# Patient Record
Sex: Male | Born: 1995 | Hispanic: No | Marital: Single | Smoking: Never smoker
Health system: Southern US, Community
[De-identification: ages and names within clinical notes are randomized; demographics above are authoritative.]

## PROBLEM LIST (undated history)

## (undated) DIAGNOSIS — T148XXA Other injury of unspecified body region, initial encounter: Secondary | ICD-10-CM

---
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
## 2020-07-12 ENCOUNTER — Other Ambulatory Visit: Payer: Self-pay

## 2020-07-12 ENCOUNTER — Emergency Department (HOSPITAL_COMMUNITY): Payer: Self-pay

## 2020-07-12 ENCOUNTER — Emergency Department (HOSPITAL_COMMUNITY)
Admission: EM | Admit: 2020-07-12 | Discharge: 2020-07-12 | Disposition: A | Discharge status: Home / Self Care | Payer: Self-pay | Attending: Emergency Medicine | Admitting: Emergency Medicine

## 2020-07-12 ENCOUNTER — Encounter (HOSPITAL_COMMUNITY): Payer: Self-pay

## 2020-07-12 DIAGNOSIS — R112 Nausea with vomiting, unspecified: Secondary | ICD-10-CM

## 2020-07-12 DIAGNOSIS — R1084 Generalized abdominal pain: Secondary | ICD-10-CM | POA: Insufficient documentation

## 2020-07-12 DIAGNOSIS — E860 Dehydration: Secondary | ICD-10-CM | POA: Insufficient documentation

## 2020-07-12 DIAGNOSIS — F1290 Cannabis use, unspecified, uncomplicated: Secondary | ICD-10-CM

## 2020-07-12 DIAGNOSIS — F12188 Cannabis abuse with other cannabis-induced disorder: Secondary | ICD-10-CM | POA: Insufficient documentation

## 2020-07-12 DIAGNOSIS — R0602 Shortness of breath: Secondary | ICD-10-CM | POA: Insufficient documentation

## 2020-07-12 DIAGNOSIS — R079 Chest pain, unspecified: Secondary | ICD-10-CM | POA: Insufficient documentation

## 2020-07-12 HISTORY — DX: Other injury of unspecified body region, initial encounter: T14.8XXA

## 2020-07-12 LAB — URINALYSIS, ROUTINE W REFLEX MICROSCOPIC
Bacteria, UA: NONE SEEN
Bilirubin Urine: NEGATIVE
Glucose, UA: NEGATIVE mg/dL
Ketones, ur: 80 mg/dL — AB
Leukocytes,Ua: NEGATIVE
Nitrite: NEGATIVE
Protein, ur: 100 mg/dL — AB
Specific Gravity, Urine: 1.033 — ABNORMAL HIGH (ref 1.005–1.030)
pH: 6 (ref 5.0–8.0)

## 2020-07-12 LAB — CBC
HCT: 51.5 % (ref 39.0–52.0)
Hemoglobin: 17.5 g/dL — ABNORMAL HIGH (ref 13.0–17.0)
MCH: 28.9 pg (ref 26.0–34.0)
MCHC: 34 g/dL (ref 30.0–36.0)
MCV: 85 fL (ref 80.0–100.0)
Platelets: 295 10*3/uL (ref 150–400)
RBC: 6.06 MIL/uL — ABNORMAL HIGH (ref 4.22–5.81)
RDW: 12.5 % (ref 11.5–15.5)
WBC: 17 10*3/uL — ABNORMAL HIGH (ref 4.0–10.5)
nRBC: 0 % (ref 0.0–0.2)

## 2020-07-12 LAB — COMPREHENSIVE METABOLIC PANEL
ALT: 21 U/L (ref 0–44)
AST: 17 U/L (ref 15–41)
Albumin: 4.8 g/dL (ref 3.5–5.0)
Alkaline Phosphatase: 60 U/L (ref 38–126)
Anion gap: 14 (ref 5–15)
BUN: 32 mg/dL — ABNORMAL HIGH (ref 6–20)
CO2: 25 mmol/L (ref 22–32)
Calcium: 10.5 mg/dL — ABNORMAL HIGH (ref 8.9–10.3)
Chloride: 101 mmol/L (ref 98–111)
Creatinine, Ser: 1.19 mg/dL (ref 0.61–1.24)
GFR, Estimated: >60 mL/min (ref >60)
Glucose, Bld: 112 mg/dL — ABNORMAL HIGH (ref 70–99)
Potassium: 3.3 mmol/L — ABNORMAL LOW (ref 3.5–5.1)
Sodium: 140 mmol/L (ref 135–145)
Total Bilirubin: 1.8 mg/dL — ABNORMAL HIGH (ref 0.3–1.2)
Total Protein: 8.8 g/dL — ABNORMAL HIGH (ref 6.5–8.1)

## 2020-07-12 LAB — LIPASE, BLOOD: Lipase: 32 U/L (ref 11–51)

## 2020-07-12 MED ORDER — METOCLOPRAMIDE HCL 5 MG/ML IJ SOLN
10.0000 mg | Freq: Once | INTRAMUSCULAR | Status: AC
  Administered 2020-07-12: 10 mg via INTRAVENOUS
  Filled 2020-07-12: qty 2

## 2020-07-12 MED ORDER — ONDANSETRON HCL 4 MG/2ML IJ SOLN
4.0000 mg | Freq: Once | INTRAMUSCULAR | Status: AC
  Administered 2020-07-12: 4 mg via INTRAVENOUS
  Filled 2020-07-12: qty 2

## 2020-07-12 MED ORDER — SODIUM CHLORIDE 0.9 % IV BOLUS
1000.0000 mL | Freq: Once | INTRAVENOUS | Status: AC
  Administered 2020-07-12: 1000 mL via INTRAVENOUS

## 2020-07-12 MED ORDER — LACTATED RINGERS IV BOLUS
1000.0000 mL | Freq: Once | INTRAVENOUS | Status: AC
  Administered 2020-07-12: 1000 mL via INTRAVENOUS

## 2020-07-12 MED ORDER — DROPERIDOL 2.5 MG/ML IJ SOLN
2.5000 mg | Freq: Once | INTRAMUSCULAR | Status: AC
  Administered 2020-07-12: 2.5 mg via INTRAVENOUS
  Filled 2020-07-12: qty 2

## 2020-07-12 MED ORDER — PROMETHAZINE HCL 25 MG PO TABS: 25.0000 mg | ORAL_TABLET | Freq: Four times a day (QID) | ORAL | 0 refills | Status: AC | PRN

## 2020-07-12 NOTE — ED Notes (Signed)
Pt ambulated to bathroom on his own power, gait even and steady. 
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
## 2020-07-12 NOTE — ED Notes (Signed)
Pt asked for and given saltine crackers. Pt sitting in chair eating crackers at this time

## 2020-07-12 NOTE — ED Notes (Signed)
Patient verbalizes understanding of discharge instructions. Opportunity for questioning and answers were provided. Pt discharged from ED. 

## 2020-07-12 NOTE — Discharge Instructions (Addendum)
Thank you for allowing me to care for you today in the Emergency Department.   I strongly suspect that your symptoms are due to marijuana use.  Sometimes instead of helping nausea and vomiting marijuana can actually cause nausea and vomiting.  I would recommend that you stop using marijuana.  Make sure that you are drinking plenty of fluids as you appeared very dehydrated today.  Gatorade and Pedialyte are available over-the-counter and can help replenish electrolytes.  You can resume the medications that you were given at [facility].  Take Phenergan and Zofran as prescribed.  Call the number on your discharge paperwork to get established with a primary care provider.  Return to the emergency department if you stop producing urine, if you pass out, develop respiratory distress, or have other new, concerning symptoms.

## 2020-07-12 NOTE — ED Notes (Signed)
Pt provided with ginger ale and water and instructed on PO challenge, Pt voiced understanding

## 2020-07-12 NOTE — ED Notes (Signed)
Patient transported to X-ray 

## 2020-07-12 NOTE — ED Triage Notes (Signed)
Pt reports that he has been having n/v for the psat 3-4 days and has not had a BM in 4 days.
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
## 2020-07-12 NOTE — ED Notes (Signed)
Pt tolerating saltine crackers and states he feels that he is ready for discharge, will notify MD

## 2020-07-12 NOTE — ED Notes (Signed)
Pt tolerating small sips of water and ginger ale, encouraged pt to attempt to drink a bit more often. Will continue to monitor

## 2020-07-13 ENCOUNTER — Emergency Department (HOSPITAL_COMMUNITY)
Admission: EM | Admit: 2020-07-13 | Discharge: 2020-07-13 | Disposition: A | Discharge status: Home / Self Care | Payer: Medicaid Other | Attending: Emergency Medicine | Admitting: Emergency Medicine

## 2020-07-13 ENCOUNTER — Encounter (HOSPITAL_COMMUNITY): Payer: Self-pay

## 2020-07-13 ENCOUNTER — Other Ambulatory Visit: Payer: Self-pay

## 2020-07-13 DIAGNOSIS — E876 Hypokalemia: Secondary | ICD-10-CM | POA: Insufficient documentation

## 2020-07-13 DIAGNOSIS — F12188 Cannabis abuse with other cannabis-induced disorder: Secondary | ICD-10-CM | POA: Insufficient documentation

## 2020-07-13 DIAGNOSIS — R112 Nausea with vomiting, unspecified: Secondary | ICD-10-CM | POA: Insufficient documentation

## 2020-07-13 LAB — CBC WITH DIFFERENTIAL/PLATELET
Basophils Absolute: 0.1 10*3/uL (ref 0.0–0.1)
Basophils Relative: 1 %
Eosinophils Absolute: 0.1 10*3/uL (ref 0.0–0.5)
Eosinophils Relative: 1 %
HCT: 47.2 % (ref 39.0–52.0)
Hemoglobin: 16.3 g/dL (ref 13.0–17.0)
Lymphocytes Relative: 11 %
Lymphs Abs: 1.3 10*3/uL (ref 0.7–4.0)
MCH: 29.5 pg (ref 26.0–34.0)
MCHC: 34.5 g/dL (ref 30.0–36.0)
MCV: 85.4 fL (ref 80.0–100.0)
Monocytes Absolute: 1.2 10*3/uL — ABNORMAL HIGH (ref 0.1–1.0)
Monocytes Relative: 10 %
Neutro Abs: 8.9 10*3/uL — ABNORMAL HIGH (ref 1.7–7.7)
Neutrophils Relative %: 77 %
Platelets: 239 10*3/uL (ref 150–400)
RBC: 5.53 MIL/uL (ref 4.22–5.81)
RDW: 12.2 % (ref 11.5–15.5)
WBC: 11.6 10*3/uL — ABNORMAL HIGH (ref 4.0–10.5)
nRBC: 0 /100 WBC

## 2020-07-13 LAB — COMPREHENSIVE METABOLIC PANEL
ALT: 53 U/L — ABNORMAL HIGH (ref 0–44)
AST: 37 U/L (ref 15–41)
Albumin: 4.3 g/dL (ref 3.5–5.0)
Alkaline Phosphatase: 53 U/L (ref 38–126)
Anion gap: 13 (ref 5–15)
BUN: 26 mg/dL — ABNORMAL HIGH (ref 6–20)
CO2: 22 mmol/L (ref 22–32)
Calcium: 9.3 mg/dL (ref 8.9–10.3)
Chloride: 102 mmol/L (ref 98–111)
Creatinine, Ser: 0.91 mg/dL (ref 0.61–1.24)
GFR, Estimated: >60 mL/min (ref >60)
Glucose, Bld: 96 mg/dL (ref 70–99)
Potassium: 2.9 mmol/L — ABNORMAL LOW (ref 3.5–5.1)
Sodium: 137 mmol/L (ref 135–145)
Total Bilirubin: 2.2 mg/dL — ABNORMAL HIGH (ref 0.3–1.2)
Total Protein: 7.6 g/dL (ref 6.5–8.1)

## 2020-07-13 LAB — LIPASE, BLOOD: Lipase: 30 U/L (ref 11–51)

## 2020-07-13 MED ORDER — POTASSIUM CHLORIDE 10 MEQ/100ML IV SOLN
10.0000 meq | INTRAVENOUS | Status: AC
  Administered 2020-07-13 (×2): 10 meq via INTRAVENOUS
  Filled 2020-07-13 (×2): qty 100

## 2020-07-13 MED ORDER — HALOPERIDOL LACTATE 5 MG/ML IJ SOLN
5.0000 mg | Freq: Once | INTRAMUSCULAR | Status: AC
  Administered 2020-07-13: 5 mg via INTRAVENOUS
  Filled 2020-07-13: qty 1

## 2020-07-13 MED ORDER — SODIUM CHLORIDE 0.9 % IV SOLN: INTRAVENOUS | Status: DC

## 2020-07-13 MED ORDER — METOCLOPRAMIDE HCL 5 MG/ML IJ SOLN
10.0000 mg | Freq: Once | INTRAMUSCULAR | Status: AC
  Administered 2020-07-13: 10 mg via INTRAVENOUS
  Filled 2020-07-13: qty 2

## 2020-07-13 MED ORDER — SODIUM CHLORIDE 0.9 % IV BOLUS
1000.0000 mL | Freq: Once | INTRAVENOUS | Status: AC
  Administered 2020-07-13: 1000 mL via INTRAVENOUS

## 2020-07-13 MED ORDER — ONDANSETRON 4 MG PO TBDP
4.0000 mg | ORAL_TABLET | Freq: Once | ORAL | Status: AC
  Administered 2020-07-13: 4 mg via ORAL
  Filled 2020-07-13: qty 1

## 2020-07-13 NOTE — ED Triage Notes (Signed)
Pt presents to ED via [facility] for generalized abdominal pain, nausea and vomiting x 1 week. Pt states he passed out today. Pt has been seen at [facility] and [facility] for the same problem.

## 2020-07-13 NOTE — Discharge Instructions (Addendum)
Continue your antinausea medicines at home.  Work on just clear liquids initially.  Potassium was a little low here today but has been replaced.  Foods high in potassium have provided above.  If still having difficulty with persistent nausea and vomiting tomorrow return.  Received IV potassium today to help build that up.
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
## 2020-07-13 NOTE — ED Notes (Signed)
Pt. Is up walking with a steady gait in their. They vomited twice. Notified pt. To slow down drinking their water. Pt. Still drank their water fast and threw up again. Notified pt. To switch to ice chips until their stomach can handle the fluids.

## 2020-09-05 ENCOUNTER — Other Ambulatory Visit: Payer: Self-pay

## 2020-09-05 ENCOUNTER — Emergency Department (HOSPITAL_COMMUNITY)
Admission: EM | Admit: 2020-09-05 | Discharge: 2020-09-06 | Payer: Self-pay | Attending: Emergency Medicine | Admitting: Emergency Medicine

## 2020-09-05 DIAGNOSIS — Z20822 Contact with and (suspected) exposure to covid-19: Secondary | ICD-10-CM | POA: Insufficient documentation

## 2020-09-05 DIAGNOSIS — J982 Interstitial emphysema: Secondary | ICD-10-CM

## 2020-09-05 DIAGNOSIS — R1084 Generalized abdominal pain: Secondary | ICD-10-CM | POA: Insufficient documentation

## 2020-09-05 DIAGNOSIS — R112 Nausea with vomiting, unspecified: Secondary | ICD-10-CM | POA: Insufficient documentation

## 2020-09-05 LAB — CBC WITH DIFFERENTIAL/PLATELET
Abs Immature Granulocytes: 0.14 10*3/uL — ABNORMAL HIGH (ref 0.00–0.07)
Basophils Absolute: 0.1 10*3/uL (ref 0.0–0.1)
Basophils Relative: 0 %
Eosinophils Absolute: 0 10*3/uL (ref 0.0–0.5)
Eosinophils Relative: 0 %
HCT: 48.2 % (ref 39.0–52.0)
Hemoglobin: 16.7 g/dL (ref 13.0–17.0)
Immature Granulocytes: 1 %
Lymphocytes Relative: 5 %
Lymphs Abs: 1.1 10*3/uL (ref 0.7–4.0)
MCH: 30.2 pg (ref 26.0–34.0)
MCHC: 34.6 g/dL (ref 30.0–36.0)
MCV: 87.2 fL (ref 80.0–100.0)
Monocytes Absolute: 1.9 10*3/uL — ABNORMAL HIGH (ref 0.1–1.0)
Monocytes Relative: 10 %
Neutro Abs: 16.9 10*3/uL — ABNORMAL HIGH (ref 1.7–7.7)
Neutrophils Relative %: 84 %
Platelets: 260 10*3/uL (ref 150–400)
RBC: 5.53 MIL/uL (ref 4.22–5.81)
RDW: 13 % (ref 11.5–15.5)
WBC: 20.2 10*3/uL — ABNORMAL HIGH (ref 4.0–10.5)
nRBC: 0 % (ref 0.0–0.2)

## 2020-09-05 LAB — COMPREHENSIVE METABOLIC PANEL
ALT: 24 U/L (ref 0–44)
AST: 26 U/L (ref 15–41)
Albumin: 5.4 g/dL — ABNORMAL HIGH (ref 3.5–5.0)
Alkaline Phosphatase: 58 U/L (ref 38–126)
Anion gap: 12 (ref 5–15)
BUN: 37 mg/dL — ABNORMAL HIGH (ref 6–20)
CO2: 23 mmol/L (ref 22–32)
Calcium: 10.3 mg/dL (ref 8.9–10.3)
Chloride: 104 mmol/L (ref 98–111)
Creatinine, Ser: 1.15 mg/dL (ref 0.61–1.24)
GFR, Estimated: >60 mL/min (ref >60)
Glucose, Bld: 117 mg/dL — ABNORMAL HIGH (ref 70–99)
Potassium: 3.4 mmol/L — ABNORMAL LOW (ref 3.5–5.1)
Sodium: 139 mmol/L (ref 135–145)
Total Bilirubin: 1.9 mg/dL — ABNORMAL HIGH (ref 0.3–1.2)
Total Protein: 8.8 g/dL — ABNORMAL HIGH (ref 6.5–8.1)

## 2020-09-05 LAB — LIPASE, BLOOD: Lipase: 28 U/L (ref 11–51)

## 2020-09-05 MED ORDER — HALOPERIDOL LACTATE 5 MG/ML IJ SOLN
5.0000 mg | Freq: Once | INTRAMUSCULAR | Status: AC
  Administered 2020-09-06: 5 mg via INTRAVENOUS
  Filled 2020-09-05: qty 1

## 2020-09-05 MED ORDER — KETOROLAC TROMETHAMINE 30 MG/ML IJ SOLN
30.0000 mg | Freq: Once | INTRAMUSCULAR | Status: AC
  Administered 2020-09-05: 30 mg via INTRAVENOUS
  Filled 2020-09-05: qty 1

## 2020-09-05 MED ORDER — ONDANSETRON HCL 4 MG PO TABS
4.0000 mg | ORAL_TABLET | Freq: Once | ORAL | Status: AC
  Administered 2020-09-06: 4 mg via ORAL
  Filled 2020-09-05 (×2): qty 1

## 2020-09-05 MED ORDER — SODIUM CHLORIDE 0.9 % IV BOLUS
1000.0000 mL | Freq: Once | INTRAVENOUS | Status: AC
  Administered 2020-09-05: 1000 mL via INTRAVENOUS

## 2020-09-05 MED ORDER — ONDANSETRON HCL 4 MG/2ML IJ SOLN
4.0000 mg | Freq: Once | INTRAMUSCULAR | Status: AC
  Administered 2020-09-05: 4 mg via INTRAVENOUS
  Filled 2020-09-05: qty 2
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
## 2020-09-05 NOTE — ED Triage Notes (Signed)
Pt arrived via [facility] c/o abd pain x3 months.  Has Nexium and Zofran prescribed.  Just an FYI: pt has ankle monitor but has not informed anyone of him living home.
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
## 2020-09-05 NOTE — ED Triage Notes (Addendum)
Pt ambulatory to triage c/o "having bacteria in my stomach."  Pt has been taking prescription meds as prescribed but states that it has not been helping.

## 2020-09-06 ENCOUNTER — Emergency Department (HOSPITAL_COMMUNITY): Payer: Self-pay

## 2020-09-06 LAB — RESP PANEL BY RT-PCR (FLU A&B, COVID) ARPGX2
Influenza A by PCR: NEGATIVE
Influenza B by PCR: NEGATIVE
SARS Coronavirus 2 by RT PCR: NEGATIVE

## 2020-09-06 MED ORDER — FLUCONAZOLE IN SODIUM CHLORIDE 200-0.9 MG/100ML-% IV SOLN: 200.0000 mg | Freq: Once | INTRAVENOUS | 0 refills | Status: AC

## 2020-09-06 MED ORDER — SODIUM CHLORIDE 0.9 % IV SOLN
12.5000 mg | Freq: Once | INTRAVENOUS | Status: AC
  Administered 2020-09-06: 12.5 mg via INTRAVENOUS
  Filled 2020-09-06: qty 0.5

## 2020-09-06 MED ORDER — IOHEXOL 300 MG/ML  SOLN
100.0000 mL | Freq: Once | INTRAMUSCULAR | Status: AC | PRN
  Administered 2020-09-06: 100 mL via INTRAVENOUS

## 2020-09-06 MED ORDER — SODIUM CHLORIDE 0.9 % IV BOLUS
1000.0000 mL | Freq: Once | INTRAVENOUS | Status: AC
  Administered 2020-09-06: 1000 mL via INTRAVENOUS

## 2020-09-06 MED ORDER — SODIUM CHLORIDE 0.9 % IV SOLN: INTRAVENOUS | Status: DC

## 2020-09-06 MED ORDER — METOCLOPRAMIDE HCL 5 MG/ML IJ SOLN
10.0000 mg | Freq: Once | INTRAMUSCULAR | Status: AC
  Administered 2020-09-06: 10 mg via INTRAVENOUS
  Filled 2020-09-06: qty 2

## 2020-09-06 MED ORDER — FENTANYL CITRATE (PF) 100 MCG/2ML IJ SOLN
50.0000 ug | Freq: Once | INTRAMUSCULAR | Status: AC
Start: 2020-09-06 — End: 2020-09-06
  Administered 2020-09-06: 50 ug via INTRAVENOUS
  Filled 2020-09-06: qty 2

## 2020-09-06 MED ORDER — PIPERACILLIN-TAZOBACTAM 3.375 G IVPB
3.3750 g | Freq: Once | INTRAVENOUS | Status: AC
  Administered 2020-09-06: 3.375 g via INTRAVENOUS
  Filled 2020-09-06: qty 50

## 2020-09-06 MED ORDER — HYDROMORPHONE HCL 1 MG/ML IJ SOLN
1.0000 mg | Freq: Once | INTRAMUSCULAR | Status: AC
Start: 2020-09-06 — End: 2020-09-06
  Administered 2020-09-06: 1 mg via INTRAVENOUS
  Filled 2020-09-06: qty 1

## 2020-09-06 MED ORDER — PROMETHAZINE HCL 25 MG/ML IJ SOLN
INTRAMUSCULAR | Status: AC
  Filled 2020-09-06: qty 1

## 2020-09-06 NOTE — ED Notes (Signed)
Pt has not voided in last hour.  U/a not obtained on 3rd shift.

## 2022-03-07 DEATH — deceased

## 2022-07-04 IMAGING — CT CT ABD-PELV W/ CM
2 of 4 series · 15 of 46 positions shown, 17 images · IV contrast (Omnipaque or Isovue)
Comparison: CT chest, abdomen and pelvis, barium swallow 06/08/2020

CLINICAL DATA: Abdominal pain for 3 months, epigastric pain

EXAM:
CT ABDOMEN AND PELVIS WITH CONTRAST
TECHNIQUE: Multidetector CT imaging of the abdomen and pelvis was performed
using the standard protocol following bolus administration of
intravenous contrast.
CONTRAST:  100mL OMNIPAQUE IOHEXOL 300 MG/ML  SOLN

[Series 2: axial st · axial · 0.63mm/px · z∈[+1033,+1438]mm · 12 of 89 slices shown, 14 images]
[im 4/89  soft-tissue]
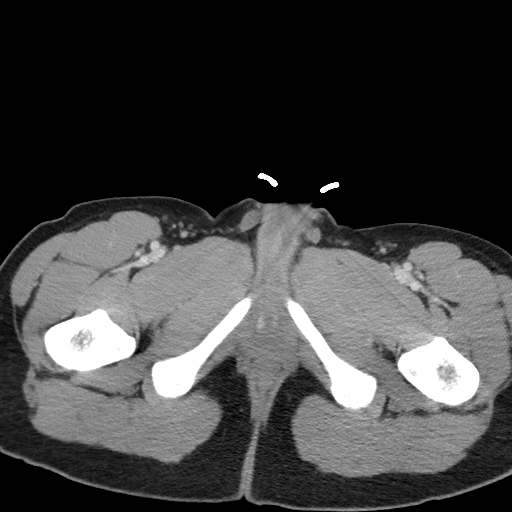
[im 4/89  bone]
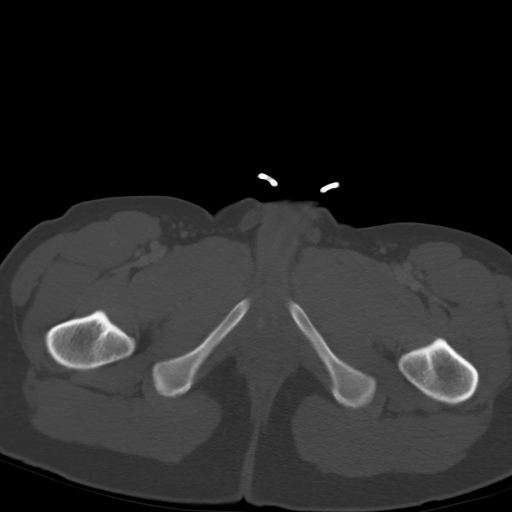
[im 12/89  soft-tissue]
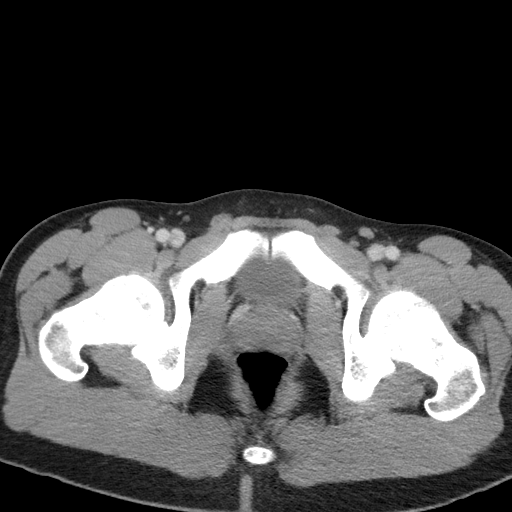
[im 20/89  soft-tissue]
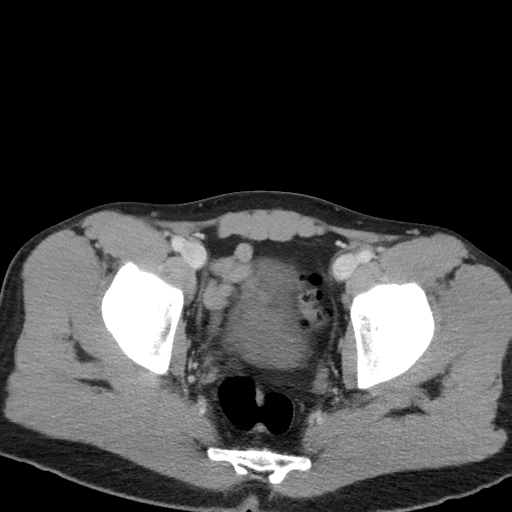
[im 27/89  soft-tissue]
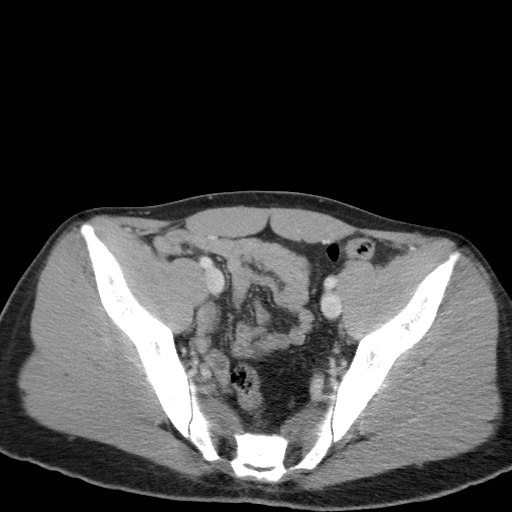
[im 35/89  soft-tissue]
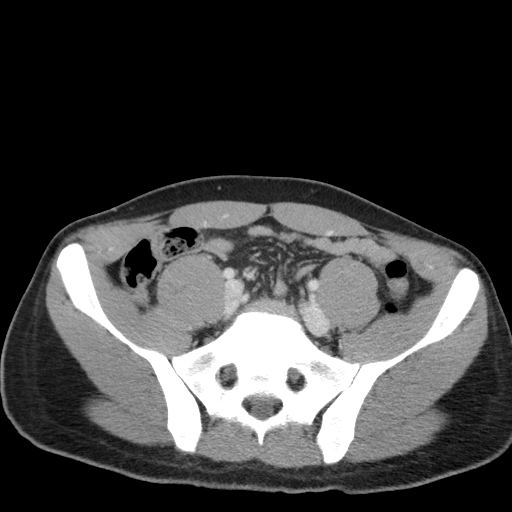
[im 43/89  soft-tissue]
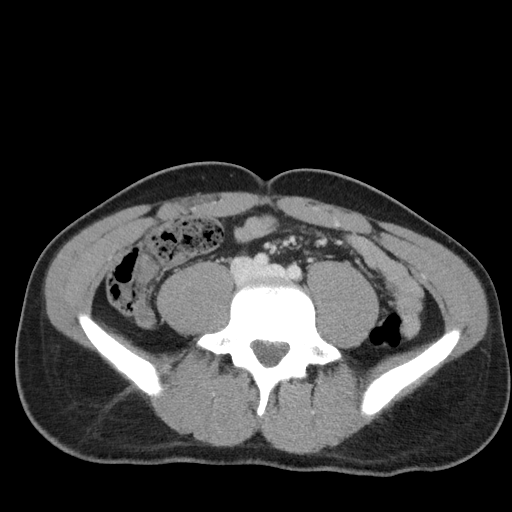
[im 46/89  soft-tissue]
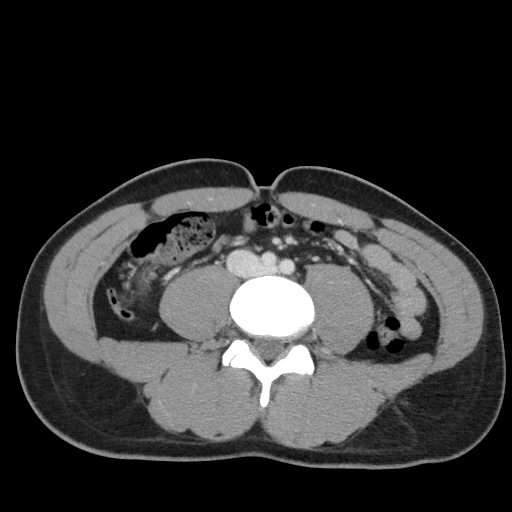
[im 54/89  soft-tissue]
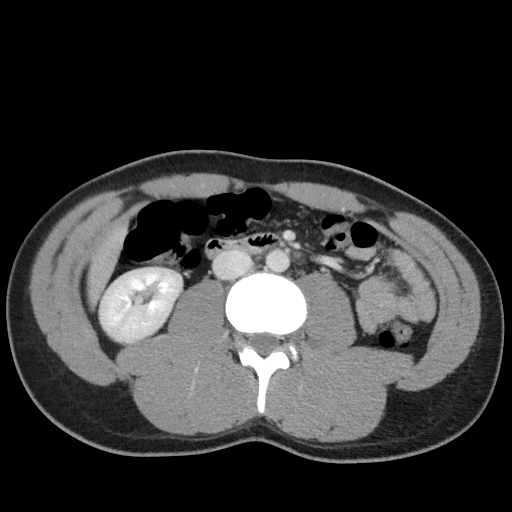
[im 62/89  soft-tissue]
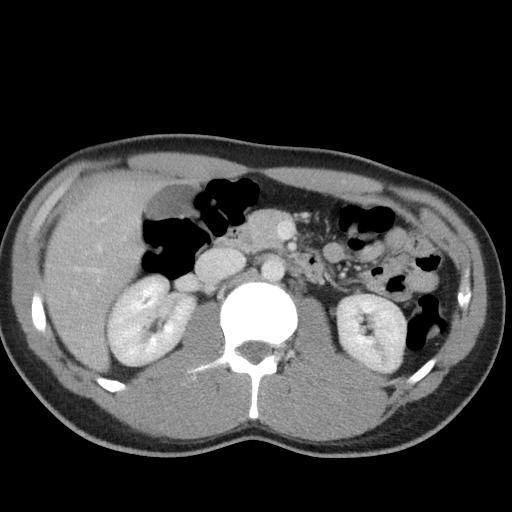
[im 62/89  bone]
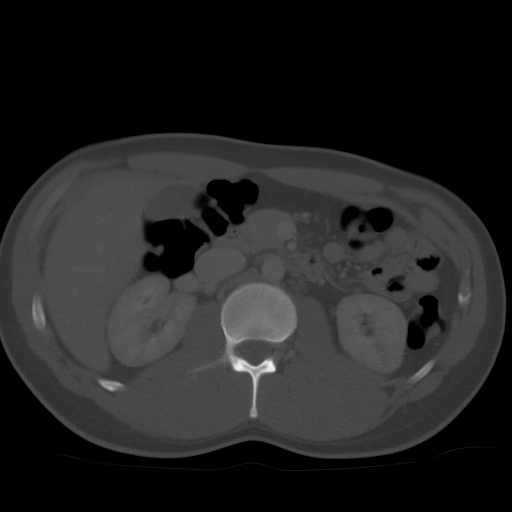
[im 69/89  soft-tissue]
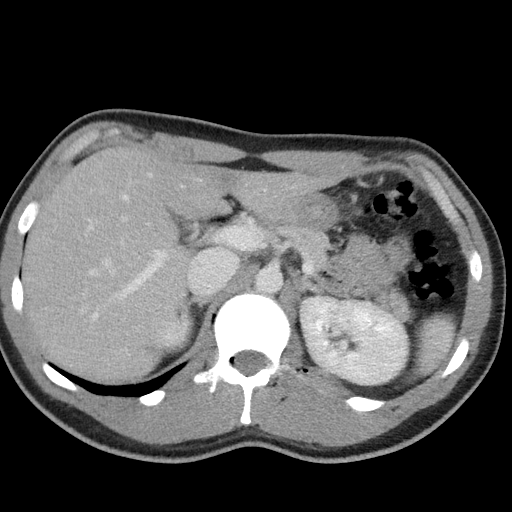
[im 77/89  soft-tissue]
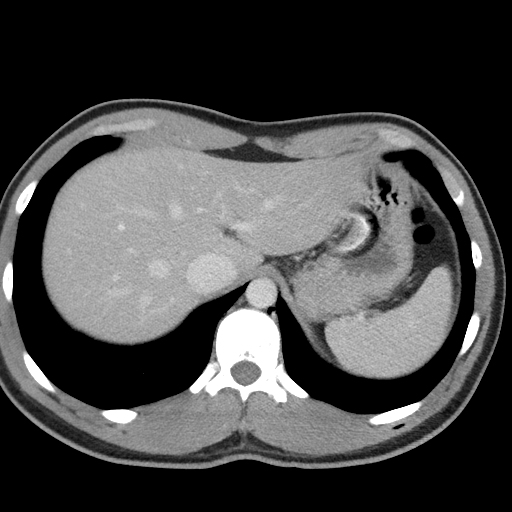
[im 85/89  soft-tissue]
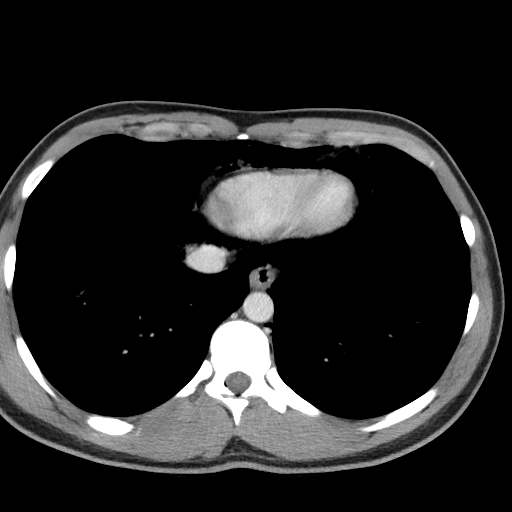

[Series 5: coronal st · coronal · 0.65mm/px · 3 of 68 slices shown]
[im 23/68  soft-tissue]
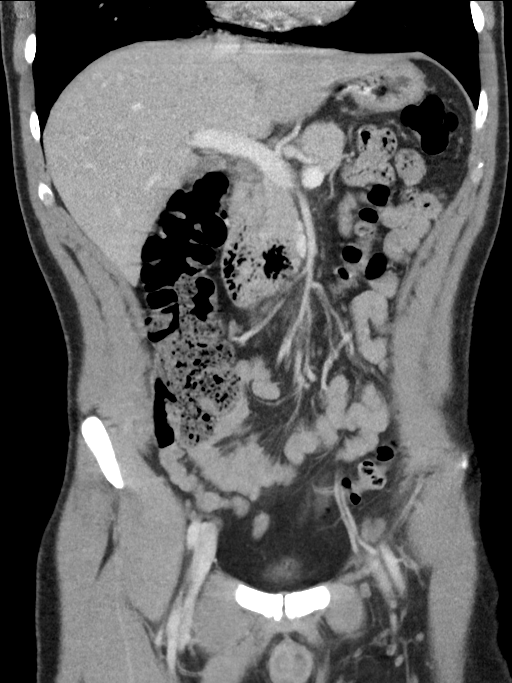
[im 30/68  soft-tissue]
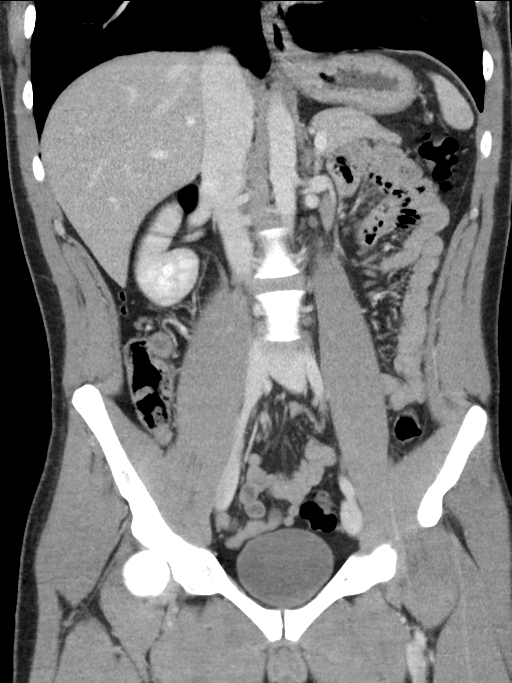
[im 38/68  soft-tissue]
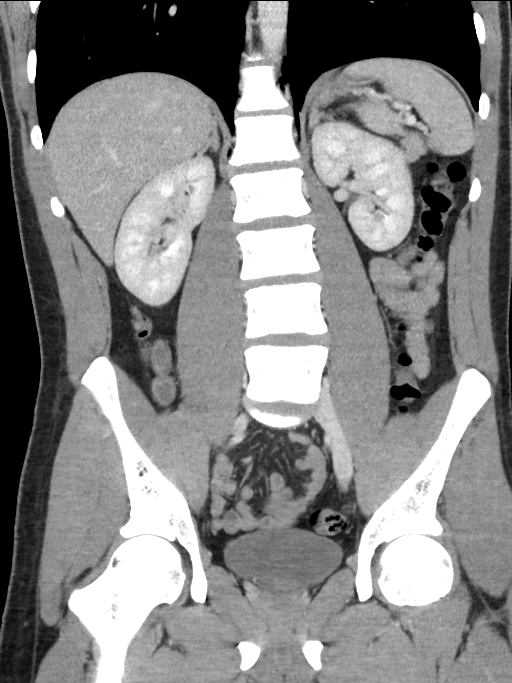

[15 of 46 positions shown; findings below may reference images not displayed]

FINDINGS: Lower chest: Extensive pneumomediastinum within the anterior, middle
and posterior mediastinum. No discernible free fluid in the
mediastinum. Small volume pneumorachis spanning the lower thoracic
levels and thoracolumbar junction to L2. Small amount of soft tissue
gas along the posterior chest wall as well. Normal cardiac size. No
clear pericardial effusion or discernible pneumopericardium is seen.
Lung bases are clear.

Hepatobiliary: No worrisome focal liver abnormality is seen. Normal
gallbladder. No visible calcified gallstones. No biliary ductal
dilatation.

Pancreas: No pancreatic ductal dilatation or surrounding
inflammatory changes.

Spleen: Normal in size. No concerning splenic lesions.

Adrenals/Urinary Tract: Normal adrenal glands. Kidneys enhance
symmetrically and uniformly. Stable subcentimeter hypoattenuating
foci in the left kidney, probable benign cysts. No urolithiasis or
hydronephrosis. Urinary bladder is unremarkable for the degree of
distention.

Stomach/Bowel: No abnormal thickening or discontinuity of the distal
thoracic esophagus. Stomach is unremarkable. Duodenum with normal
sweep across the midline abdomen. No small bowel thickening or
dilatation. Normal appendix in the right lower quadrant. No colonic
dilatation or wall thickening. No evidence of bowel obstruction.

Vascular/Lymphatic: No significant vascular findings are present. No
enlarged abdominal or pelvic lymph nodes.

Reproductive: Prostate is unremarkable.

Other: No free intraperitoneal air or fluid. Pneumomediastinum, gas
along the chest wall and pneumorachis, as above. No bowel containing
hernia.

Musculoskeletal: No acute osseous abnormality or suspicious osseous
lesion.
IMPRESSION: Extensive pneumomediastinum with gas tracking through the anterior,
middle and posterior mediastinum as well as small amount of gas
along the posterior chest wall and pneumorachis. Pneumomediastinum
was present on comparison imaging from 06/08/2020 though finding is
certainly more extensive on today's exam. No worrisome esophageal or
gastric thickening or other CT evidence to point to a definitive
source of this mediastinal gas.

No other acute CT evident abnormality of the abdomen or pelvis.

These results were called by telephone at the time of interpretation
on 09/06/2020 at [DATE] to provider NAZARETH JUMPER , who verbally
acknowledged these results.

## 2022-07-04 IMAGING — CT CT CHEST W/O CM
2 of 4 series · 15 of 36 positions shown, 18 images · non-contrast
Comparison: June 08, 2020

CLINICAL DATA: Abdominal pain and vomiting.

EXAM:
CT CHEST WITHOUT CONTRAST
TECHNIQUE: Multidetector CT imaging of the chest was performed following the
standard protocol without IV contrast.

[Series 2: routine chest without · axial · non-contrast · 0.66mm/px · z∈[+1253,+1555]mm · 12 of 179 slices shown, 15 images]
[im 14/179  mediastinal]
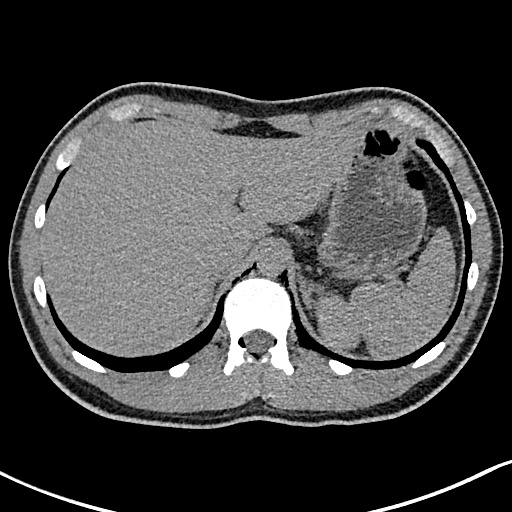
[im 14/179  lung]
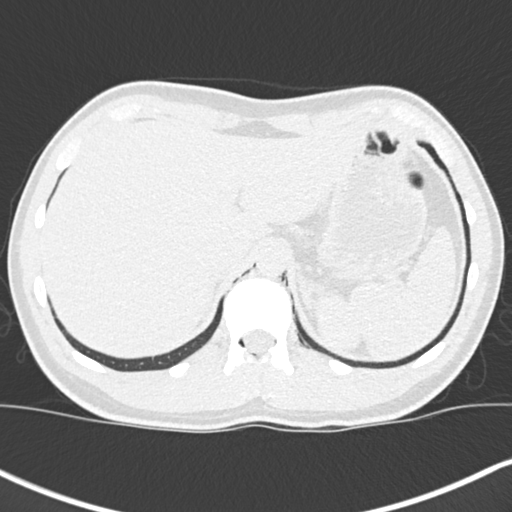
[im 28/179  lung]
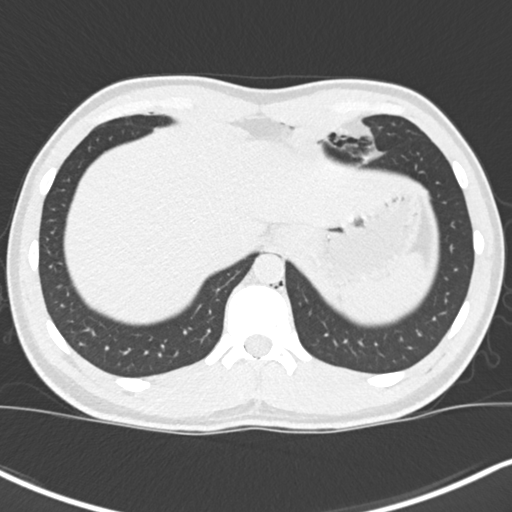
[im 42/179  lung]
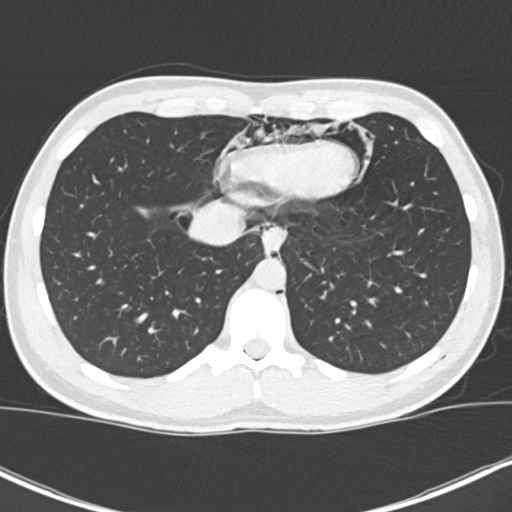
[im 55/179  lung]
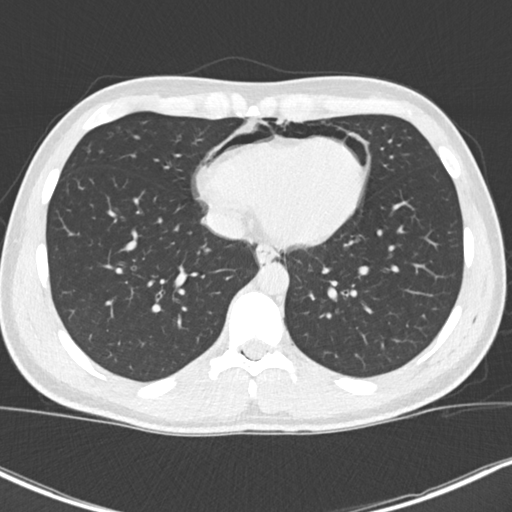
[im 69/179  mediastinal]
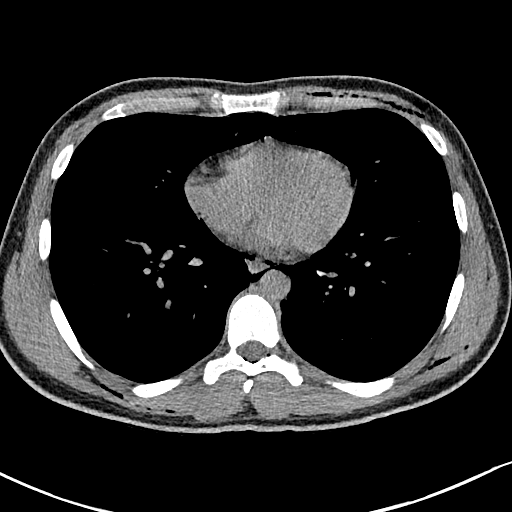
[im 69/179  lung]
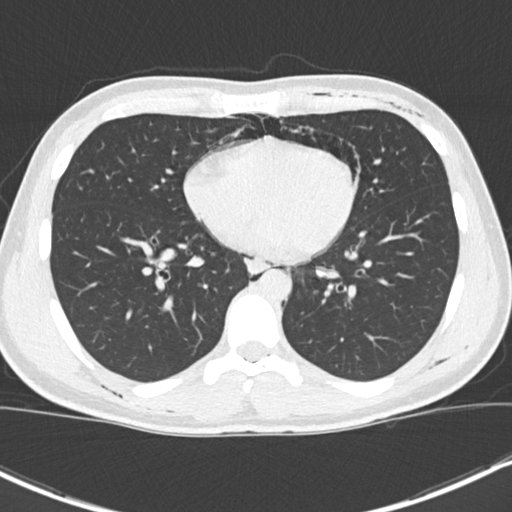
[im 83/179  lung]
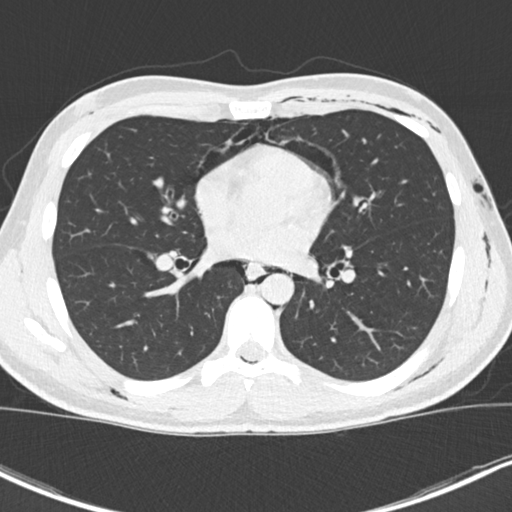
[im 96/179  lung]
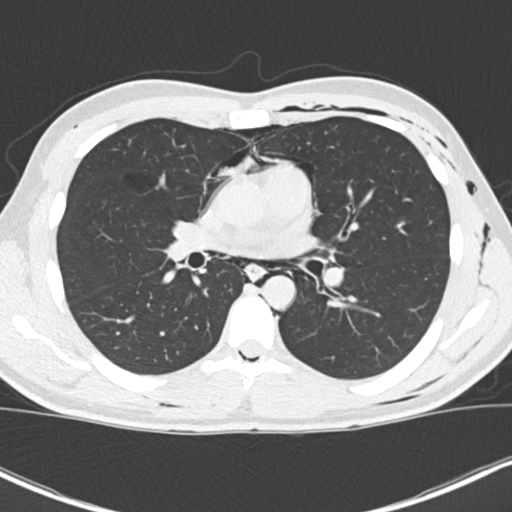
[im 110/179  lung]
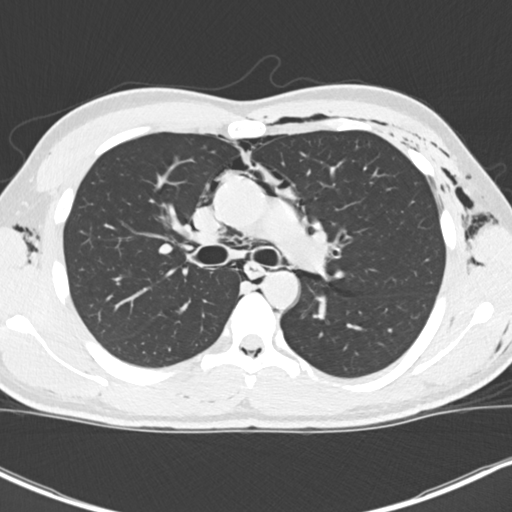
[im 124/179  mediastinal]
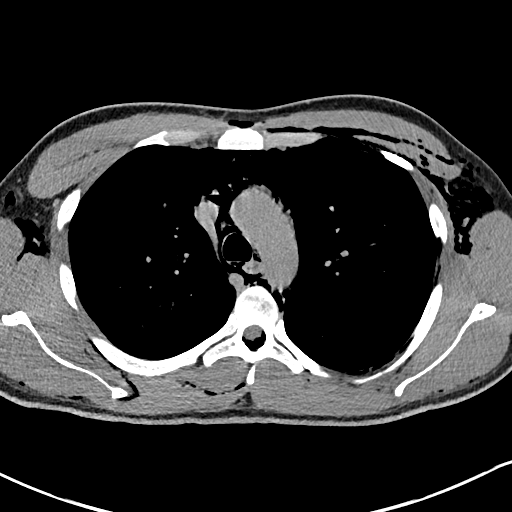
[im 124/179  lung]
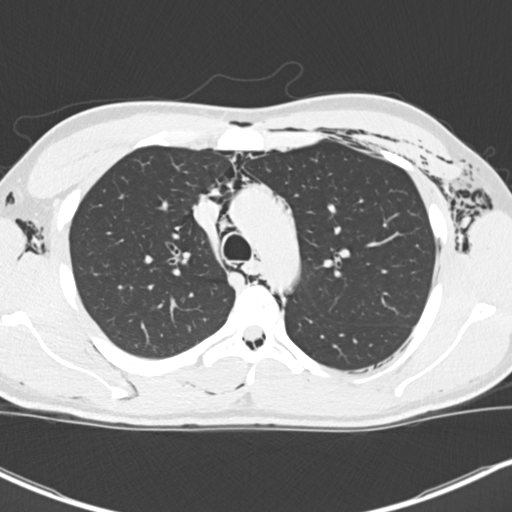
[im 137/179  lung]
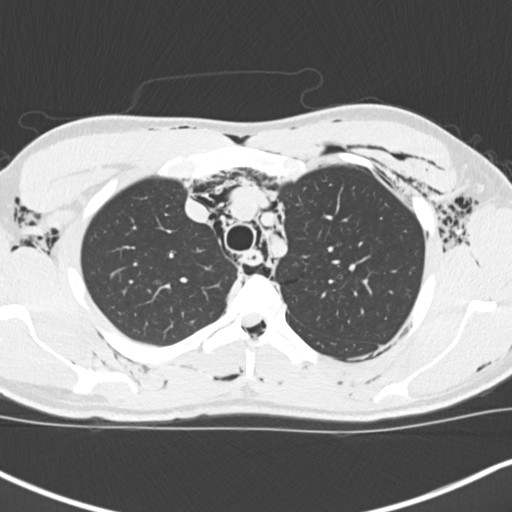
[im 151/179  lung]
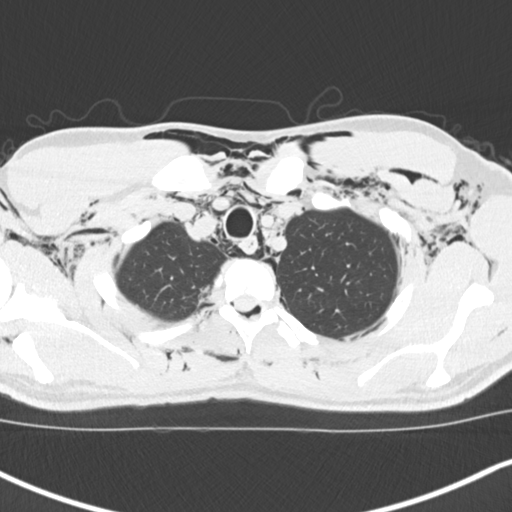
[im 165/179  lung]
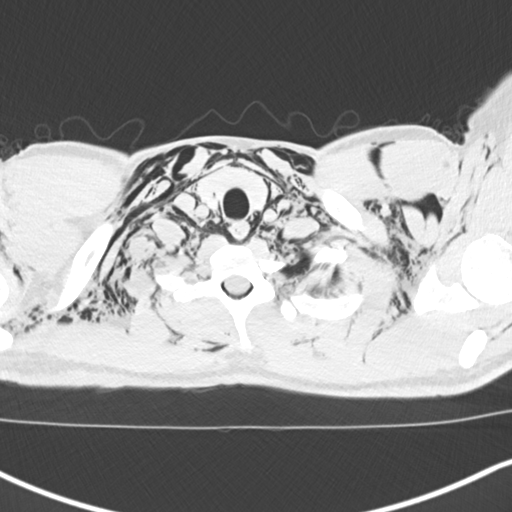

[Series 6: coronal · coronal · 0.70mm/px · 3 of 116 slices shown]
[im 24/116  lung]
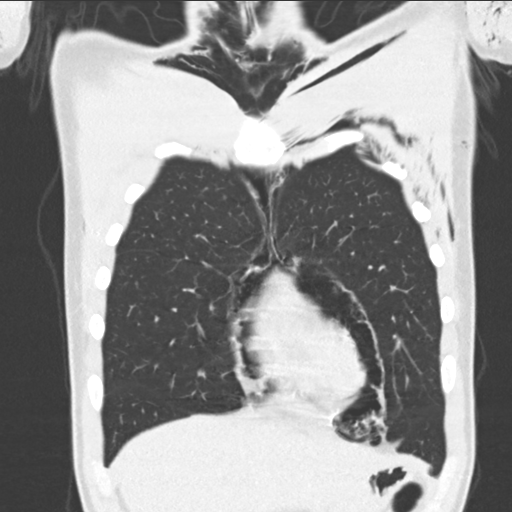
[im 47/116  lung]
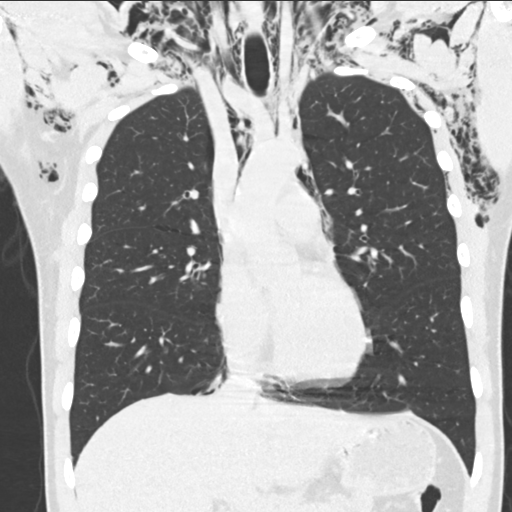
[im 70/116  lung]
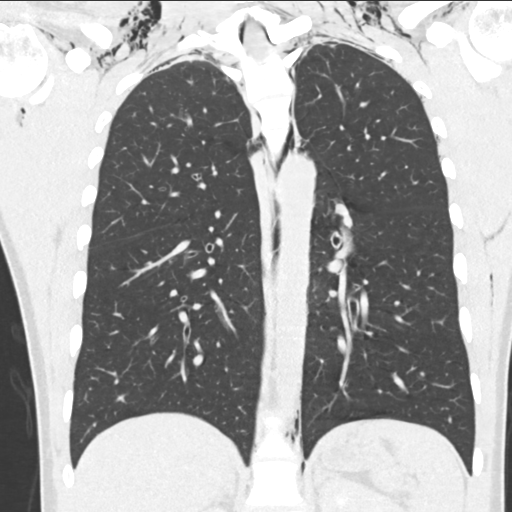

[15 of 36 positions shown; findings below may reference images not displayed]

FINDINGS: Cardiovascular: No significant vascular findings. Normal heart size.
No pericardial effusion.

Mediastinum/Nodes: An extensive amount of air is seen throughout the
mediastinum. This is markedly increased in severity when compared to
the prior study. No enlarged mediastinal or axillary lymph nodes.
The thyroid gland, trachea and esophagus demonstrate no significant
abnormality.

Lungs/Pleura: There is no evidence of acute infiltrate or pleural
effusion.

A small amount of pleural air is suspected on the left, surrounding
the left upper lobe. This extends from the level of the left apex to
the mid left lung and is not seen on the prior study.

Upper Abdomen: No acute abnormality.

Musculoskeletal: An extensive amount of subcutaneous emphysema is
seen throughout the chest wall and bilateral neck soft tissues. This
is markedly increased in severity when compared to the prior study,
as a small amount of soft tissue air is seen within the left neck on
the prior exam. Air is also seen extending along the posterior and
lateral aspect of the spinal canal.

No acute osseous abnormalities are identified.
IMPRESSION: 1. Marked severity pneumomediastinum and subcutaneous emphysema of
unclear etiology, markedly increased in severity when compared to
the prior study. Thoracic surgical consultation is recommended.
